# Patient Record
Sex: Female | Born: 1975 | Race: White | Hispanic: No | Marital: Married
Health system: Southern US, Community
[De-identification: ages and names within clinical notes are randomized; demographics above are authoritative.]

## PROBLEM LIST (undated history)

## (undated) DIAGNOSIS — I429 Cardiomyopathy, unspecified: Secondary | ICD-10-CM

---

## 2017-08-31 ENCOUNTER — Emergency Department (HOSPITAL_COMMUNITY): Payer: BLUE CROSS/BLUE SHIELD

## 2017-08-31 ENCOUNTER — Encounter (HOSPITAL_COMMUNITY): Payer: Self-pay | Admitting: Emergency Medicine

## 2017-08-31 ENCOUNTER — Other Ambulatory Visit: Payer: Self-pay

## 2017-08-31 ENCOUNTER — Emergency Department (HOSPITAL_COMMUNITY)
Admission: EM | Admit: 2017-08-31 | Discharge: 2017-08-31 | Disposition: A | Discharge status: Home / Self Care | Payer: BLUE CROSS/BLUE SHIELD | Attending: Emergency Medicine | Admitting: Emergency Medicine

## 2017-08-31 DIAGNOSIS — R002 Palpitations: Secondary | ICD-10-CM | POA: Diagnosis not present

## 2017-08-31 DIAGNOSIS — D649 Anemia, unspecified: Secondary | ICD-10-CM | POA: Diagnosis not present

## 2017-08-31 DIAGNOSIS — R55 Syncope and collapse: Secondary | ICD-10-CM | POA: Insufficient documentation

## 2017-08-31 HISTORY — DX: Cardiomyopathy, unspecified: I42.9

## 2017-08-31 LAB — URINALYSIS, ROUTINE W REFLEX MICROSCOPIC
Bilirubin Urine: NEGATIVE
GLUCOSE, UA: NEGATIVE mg/dL
HGB URINE DIPSTICK: NEGATIVE
KETONES UR: NEGATIVE mg/dL
Leukocytes, UA: NEGATIVE
Nitrite: NEGATIVE
PH: 6 (ref 5.0–8.0)
Protein, ur: NEGATIVE mg/dL
Specific Gravity, Urine: 1.019 (ref 1.005–1.030)

## 2017-08-31 LAB — BASIC METABOLIC PANEL
Anion gap: 7 (ref 5–15)
BUN: 13 mg/dL (ref 6–20)
CHLORIDE: 104 mmol/L (ref 98–111)
CO2: 27 mmol/L (ref 22–32)
CREATININE: 0.62 mg/dL (ref 0.44–1.00)
Calcium: 9.4 mg/dL (ref 8.9–10.3)
GFR calc Af Amer: >60 mL/min (ref >60)
GFR calc non Af Amer: >60 mL/min (ref >60)
Glucose, Bld: 101 mg/dL — ABNORMAL HIGH (ref 70–99)
POTASSIUM: 3.9 mmol/L (ref 3.5–5.1)
Sodium: 138 mmol/L (ref 135–145)

## 2017-08-31 LAB — POC OCCULT BLOOD, ED: FECAL OCCULT BLD: NEGATIVE

## 2017-08-31 LAB — CBC
HEMATOCRIT: 31 % — AB (ref 36.0–46.0)
Hemoglobin: 9.6 g/dL — ABNORMAL LOW (ref 12.0–15.0)
MCH: 24.7 pg — AB (ref 26.0–34.0)
MCHC: 31 g/dL (ref 30.0–36.0)
MCV: 79.7 fL (ref 78.0–100.0)
PLATELETS: 295 10*3/uL (ref 150–400)
RBC: 3.89 MIL/uL (ref 3.87–5.11)
RDW: 13.9 % (ref 11.5–15.5)
WBC: 5 10*3/uL (ref 4.0–10.5)

## 2017-08-31 LAB — I-STAT BETA HCG BLOOD, ED (MC, WL, AP ONLY)

## 2017-08-31 LAB — BRAIN NATRIURETIC PEPTIDE: B NATRIURETIC PEPTIDE 5: 23.9 pg/mL (ref 0.0–100.0)

## 2017-08-31 LAB — CBG MONITORING, ED: Glucose-Capillary: 94 mg/dL (ref 70–99)

## 2017-08-31 LAB — TROPONIN I

## 2017-08-31 MED ORDER — FERROUS SULFATE 325 (65 FE) MG PO TABS: 325.0000 mg | ORAL_TABLET | Freq: Every day | ORAL | 0 refills | Status: AC

## 2017-08-31 MED ORDER — SODIUM CHLORIDE 0.9 % IV BOLUS
500.0000 mL | Freq: Once | INTRAVENOUS | Status: AC
  Administered 2017-08-31: 500 mL via INTRAVENOUS

## 2017-08-31 NOTE — ED Notes (Signed)
Patient transported to X-ray 

## 2017-08-31 NOTE — ED Notes (Signed)
Discharge instructions discussed with Pt. Pt verbalized understanding. Pt stable and ambulatory.    

## 2017-08-31 NOTE — Discharge Instructions (Addendum)
Please take the iron supplement once daily for the next 2 weeks.  Please follow-up with your primary care doctor to have your hemoglobin rechecked.  Please follow-up with your cardiologist in regards to today's visit.  Please return to the emergency department immediately if you have any new or worsening symptoms in the meantime.

## 2017-08-31 NOTE — ED Notes (Signed)
Pt reports hands feeling really cold on the way to the hospital like they weren't getting enough blood flow.

## 2017-08-31 NOTE — ED Provider Notes (Signed)
[facility] Provider Note   CSN: [id number] Arrival date & time: 08/31/17  1106     History   Chief Complaint Chief Complaint  Patient presents with  . Near Syncope   History provided by pt and her husband at bedside.  HPI Madison Lynn is a 42 y.o. female.  HPI   Pt is a 42 y/o female with a h/o cardiomyopathy and anemia who presents to the ED today for evaluation of near syncope which occurred earlier today. she states she started having palpitations while standing. She felt like her heart was slowing and she was about to pass out. States that she then laid onto the ground. Denies that she fell or hit her head. She denies that she had a syncopal episode. She states she has had sxs like this before. She reports associated shortness of breath and cold hands, but denies associated chest pain, nausea, diaphoresis, numbness/weakness. She denies any other associated sxs. States that currently she feels grossly back to baseline, though she still feels like she is having some PVC's. States it is common for her to have PVC's. Denies leg pain/swelling, hemoptysis, recent surgery/trauma, recent long travel (plane ride here only 2 hours), hormone use, personal hx of cancer, or hx of DVT/PE. Recent heavy menses 2 weeks ago. H/o heavy menses and anemia. Not currently on iron.  Pt is visiting here for the weekend. [place] cardi[place] in [place], [place]. states that he has had palpitations over the last several months. She wore a holter monitor and had several episodes of unsustained vtach. States her cardiologist was monitoring her for this. She continued to have episodes of palpitations and wore another monitor. She was then found to have atrial flutter. States she was scheduled for an ablation yesterday in [place]. States that she spoke with her cardiologist and asked if it was okay to travel since she has not been symptomatic recently. She was cleared  to travel this weekend. She plans to go back to [place], [place] on 09/03/17.   Records reviewed, pt had cardiac MRI on 08/25/17 which showed, "1.  The left ventricle is normal in size with preserved systolic  function. Ejection fraction is quantified to be 53%, when compared to  prior examination of 49%  Quantitative left ventricular functional  values are as described above. Viability/scar imaging is normal.  There is no evidence of prior  myocardial damage. No abnormal enhancement of the myocardium is seen  after contrast administration. Myocardial T1 imaging was within  normal reference range, consistent with viability/scar imaging, with  no evidence of diffuse fibrosis present. No evidence of hypertrophic cardiomyopathy or left ventricular  Noncompaction. There is reduction in global left ventricular long axis strain,  quantified to be approximately -11.3%. 2.  The right ventricle is normal in size and function. No localised  or generalized aneurysmal dilation is seen."  Pt states her BP normally runs in the 90s/50s.   Cardiologist: Dr. Alford HighlandBiswajit Karr, [facility], [place], [place]   Past Medical History:  Diagnosis Date  . Cardiomyopathy (HCC)     There are no active problems to display for this patient.   History reviewed. No pertinent surgical history.   OB History   None      Home Medications    Prior to Admission medications   Medication Sig Start Date End Date Taking? Authorizing Provider  ferrous sulfate 325 (65 FE) MG tablet Take 1 tablet (  325 mg total) by mouth daily for 14 days. 08/31/17 09/14/17  Lora Chavers S, PA-C    Family History No family history on file.  Social History Social History   Tobacco Use  . Smoking status: Not on file  Substance Use Topics  . Alcohol use: Not on file  . Drug use: Not on file     Allergies   Latex   Review of Systems Review of Systems  Constitutional: Negative for fever.  HENT: Negative  for congestion and rhinorrhea.   Eyes: Negative for visual disturbance.  Respiratory: Positive for shortness of breath. Negative for cough.   Cardiovascular: Positive for palpitations. Negative for chest pain and leg swelling.  Gastrointestinal: Negative for abdominal pain, blood in stool, constipation, diarrhea, nausea and vomiting.  Genitourinary: Negative for dysuria, frequency and urgency.  Musculoskeletal: Negative for back pain.  Skin: Negative for wound.  Neurological: Positive for light-headedness. Negative for weakness and numbness.       Near syncope    Physical Exam Updated Vital Signs BP (!) 94/56   Pulse 66   Temp 98.3 F (36.8 C) (Oral)   Resp 14   LMP 08/17/2017   SpO2 99%   Physical Exam  Constitutional: She appears well-developed and well-nourished. No distress.  HENT:  Head: Normocephalic and atraumatic.  Mouth/Throat: Oropharynx is clear and moist.  Eyes: Conjunctivae are normal.  Neck: Neck supple.  Cardiovascular: Normal rate, regular rhythm and normal heart sounds.  No murmur heard. Pulmonary/Chest: Effort normal and breath sounds normal. No stridor. No respiratory distress. She has no wheezes.  Abdominal: Soft. Bowel sounds are normal. There is no tenderness. There is no guarding.  Musculoskeletal:  No calf TTP, erythema, swelling.  Neurological: She is alert.  Skin: Skin is warm and dry. Capillary refill takes less than 2 seconds.  Psychiatric: She has a normal mood and affect.  Nursing note and vitals reviewed.   ED Treatments / Results  Labs (all labs ordered are listed, but only abnormal results are displayed) Labs Reviewed  BASIC METABOLIC PANEL - Abnormal; Notable for the following components:      Result Value   Glucose, Bld 101 (*)    All other components within normal limits  CBC - Abnormal; Notable for the following components:   Hemoglobin 9.6 (*)    HCT 31.0 (*)    MCH 24.7 (*)    All other components within normal limits    URINALYSIS, ROUTINE W REFLEX MICROSCOPIC  BRAIN NATRIURETIC PEPTIDE  TROPONIN I  CBG MONITORING, ED  I-STAT BETA HCG BLOOD, ED ([facility], [facility], [facility] ONLY)  POC OCCULT BLOOD, ED    EKG EKG Interpretation  Date/Time:  Wednesday August 31 2017 11:13:19 EDT Ventricular Rate:  91 PR Interval:  160 QRS Duration: 80 QT Interval:  376 QTC Calculation: 462 R Axis:   -100 Text Interpretation:  Normal sinus rhythm with sinus arrhythmia Possible Left atrial enlargement Right superior axis deviation Anterior infarct , age undetermined Abnormal ECG Confirmed by Bethann Berkshire [phone]) on 08/31/2017 1:47:58 PM   Radiology Dg Chest 2 View  Result Date: 08/31/2017 CLINICAL DATA:  History of cardiomyopathy. Irregular cardiac rhythm this morning. The patient is scheduled for an ablation procedure on August 28 and [place]. EXAM: CHEST - 2 VIEW COMPARISON:  None in PACs FINDINGS: The lungs are mildly hyperinflated but clear. The heart and pulmonary vascularity are normal. The mediastinum is normal in width. There is no pleural effusion. The bony thorax exhibits no acute abnormality.  IMPRESSION: There is no CHF, pneumonia, nor other acute cardiopulmonary abnormality. Electronically Signed   By: David  [place] M.D.   On: 08/31/2017 13:38    Procedures Procedures (including critical care time)  Medications Ordered in ED Medications  sodium chloride 0.9 % bolus 500 mL (0 mLs Intravenous Stopped 08/31/17 1439)     Initial Impression / Assessment and Plan / ED Course  I have reviewed the triage vital signs and the nursing notes.  Pertinent labs & imaging results that were available during my care of the patient were reviewed by me and considered in my medical decision making (see chart for details).    Discussed pt presentation, exam findings, and workup with Dr. Estell Harpin, who feels that if the patients workup is negative then she is appropriate for discharge with close f/u with her cardiologist.   Final Clinical  Impressions(s) / ED Diagnoses   Final diagnoses:  Near syncope  Anemia, unspecified type   Presenting for evaluation of a near syncopal episode which occurred prior to arrival.  Initially patient is borderline hypotensive, which she states is chronic for her.  Her vital signs are otherwise stable throughout her stay in the ED.  Her work-up today has been fairly benign.  Her CBC she does show some anemia and she does report a history of this.  Suspect secondary to NSAID use.  Has history of heavy periods and recently had her menses 2 weeks ago.  Is not currently on iron supplementation.  We will start her on supplementation and have her follow-up with her PCP in regards to this.  CMP, troponin, BNP are all within normal limits.  No gross electrolyte derangement.  UA negative.  pRegnancy test negative.  Hemoccult negative for blood.  ECG shows normal sinus rhythm with sinus arrhythmia.  Possible left atrial enlargement and right superior axis deviation.  A anterior infarct noted with age undetermined, do not suspect patient having ischemic symptoms today she is asymptomatic and denies any chest pain.  Patient has a history of cardiomyopathy and states that she has had palpitations in the past which have caused her to have near syncopal episodes. Her symptoms today seem consistent with symptoms she has had in the past. Denies chest pain. Her cardiologist cleared her to come to [place] for the week for vacation.  She has been observed in the ED for several hours and has remained asymptomatic and stable.  Feel that she is safe for discharge with close follow-up with her PCP at home.  Plan communicated with patient and her husband at bedside who voiced understanding of plan and reasons to return immediately to the ED.  All questions were answered.  ED Discharge Orders        Ordered    ferrous sulfate 325 (65 FE) MG tablet  Daily     08/31/17 [address] [place], PA-C 08/31/17 1538      Bethann Berkshire, MD 08/31/17 [phone]

## 2017-08-31 NOTE — ED Triage Notes (Signed)
Patient with history of cardiomyopathy complains of multiple near syncope episodes over the last two weeks. States today she felt like her heart felt like it paused today. Patient alert, oriented, and in no apparent distress at this time.

## 2017-08-31 NOTE — ED Notes (Addendum)
Pt states her bp is usually 90's/50's at baseline. PA Couture notified.

## 2019-07-06 IMAGING — DX DG CHEST 2V
2 series · 2 of 2 positions shown · non-contrast
Comparison: None in PACs

CLINICAL DATA: History of cardiomyopathy. Irregular cardiac rhythm
this morning. The patient is scheduled for an ablation procedure on
[DATE] and [HOSPITAL].

EXAM:
CHEST - 2 VIEW

[chest pa]
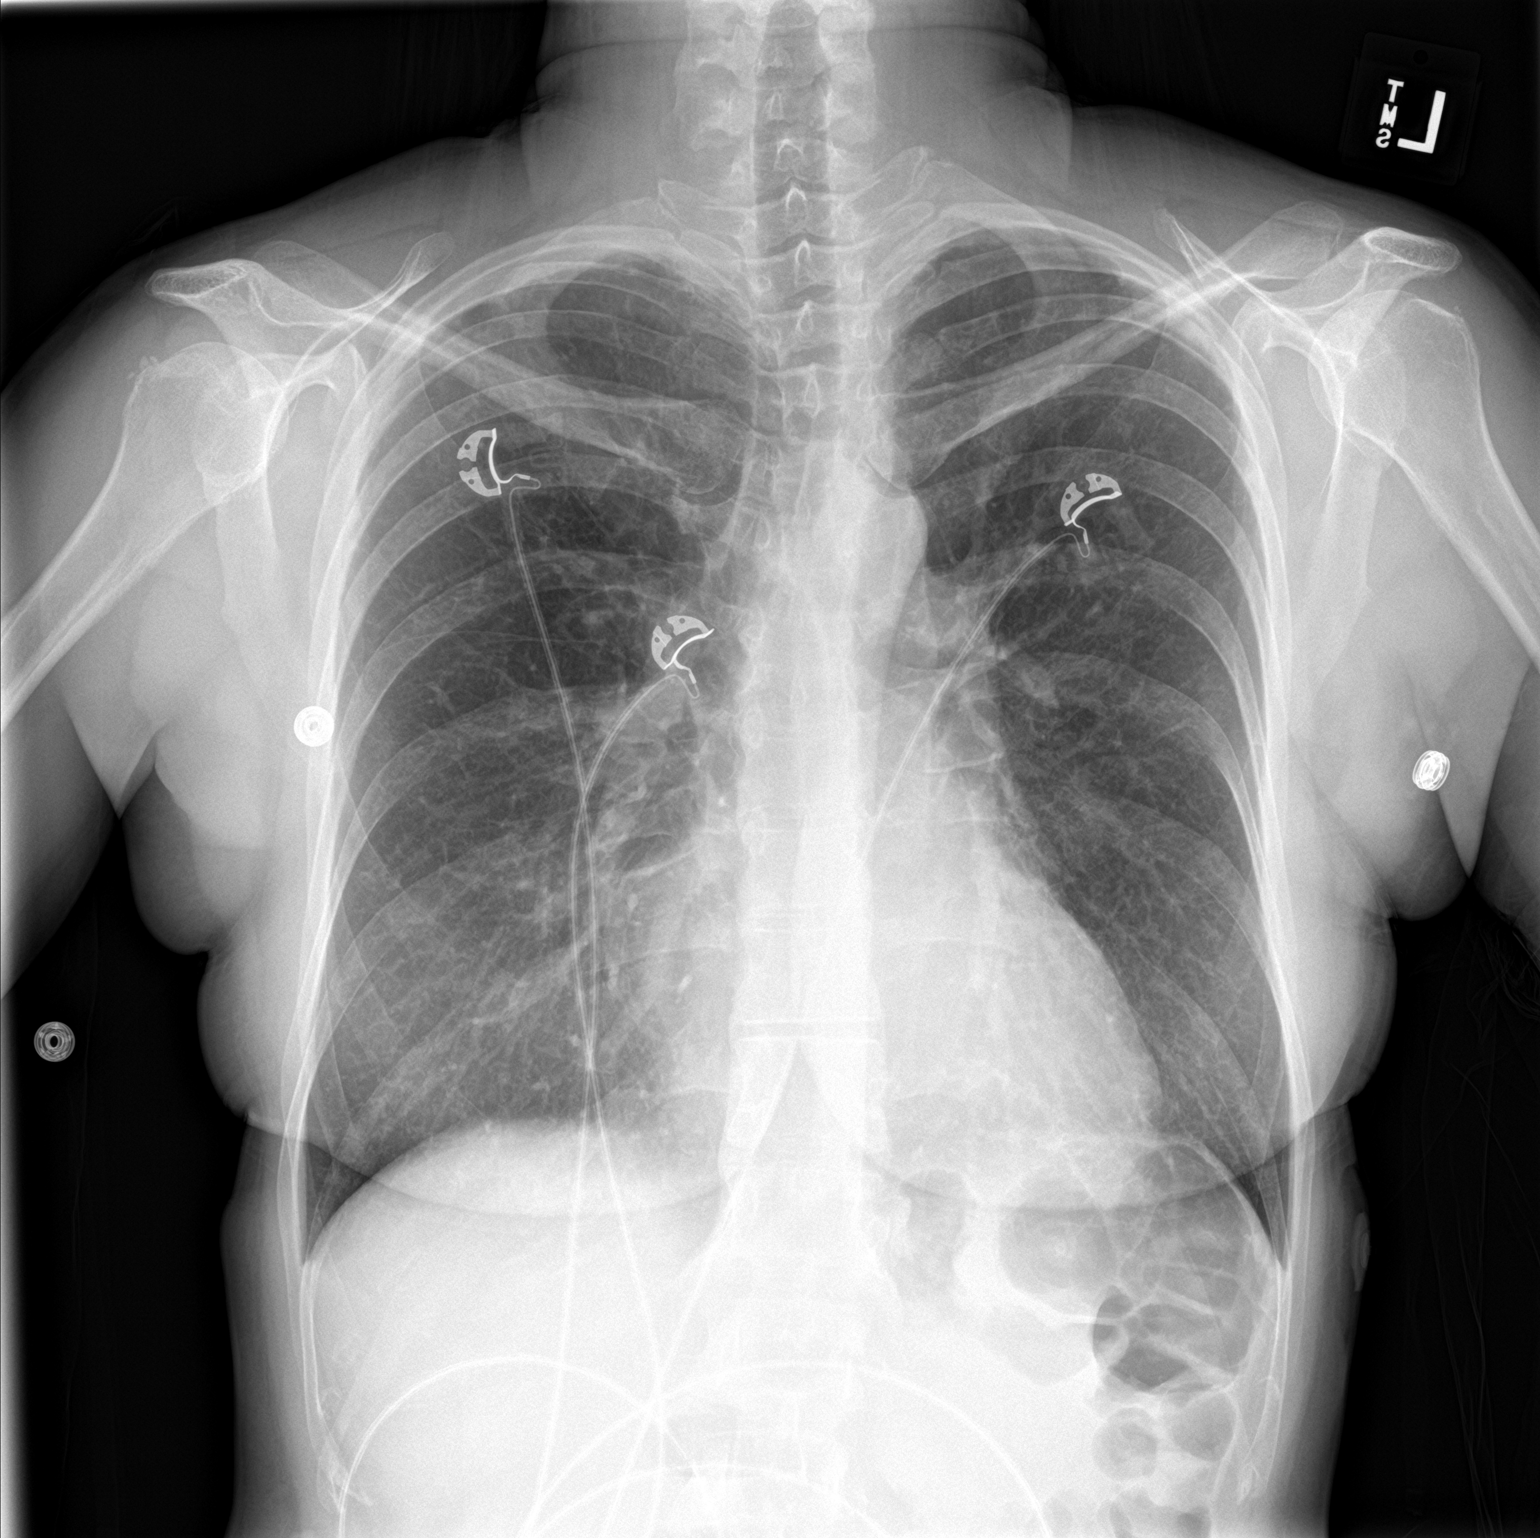

[chest lat]
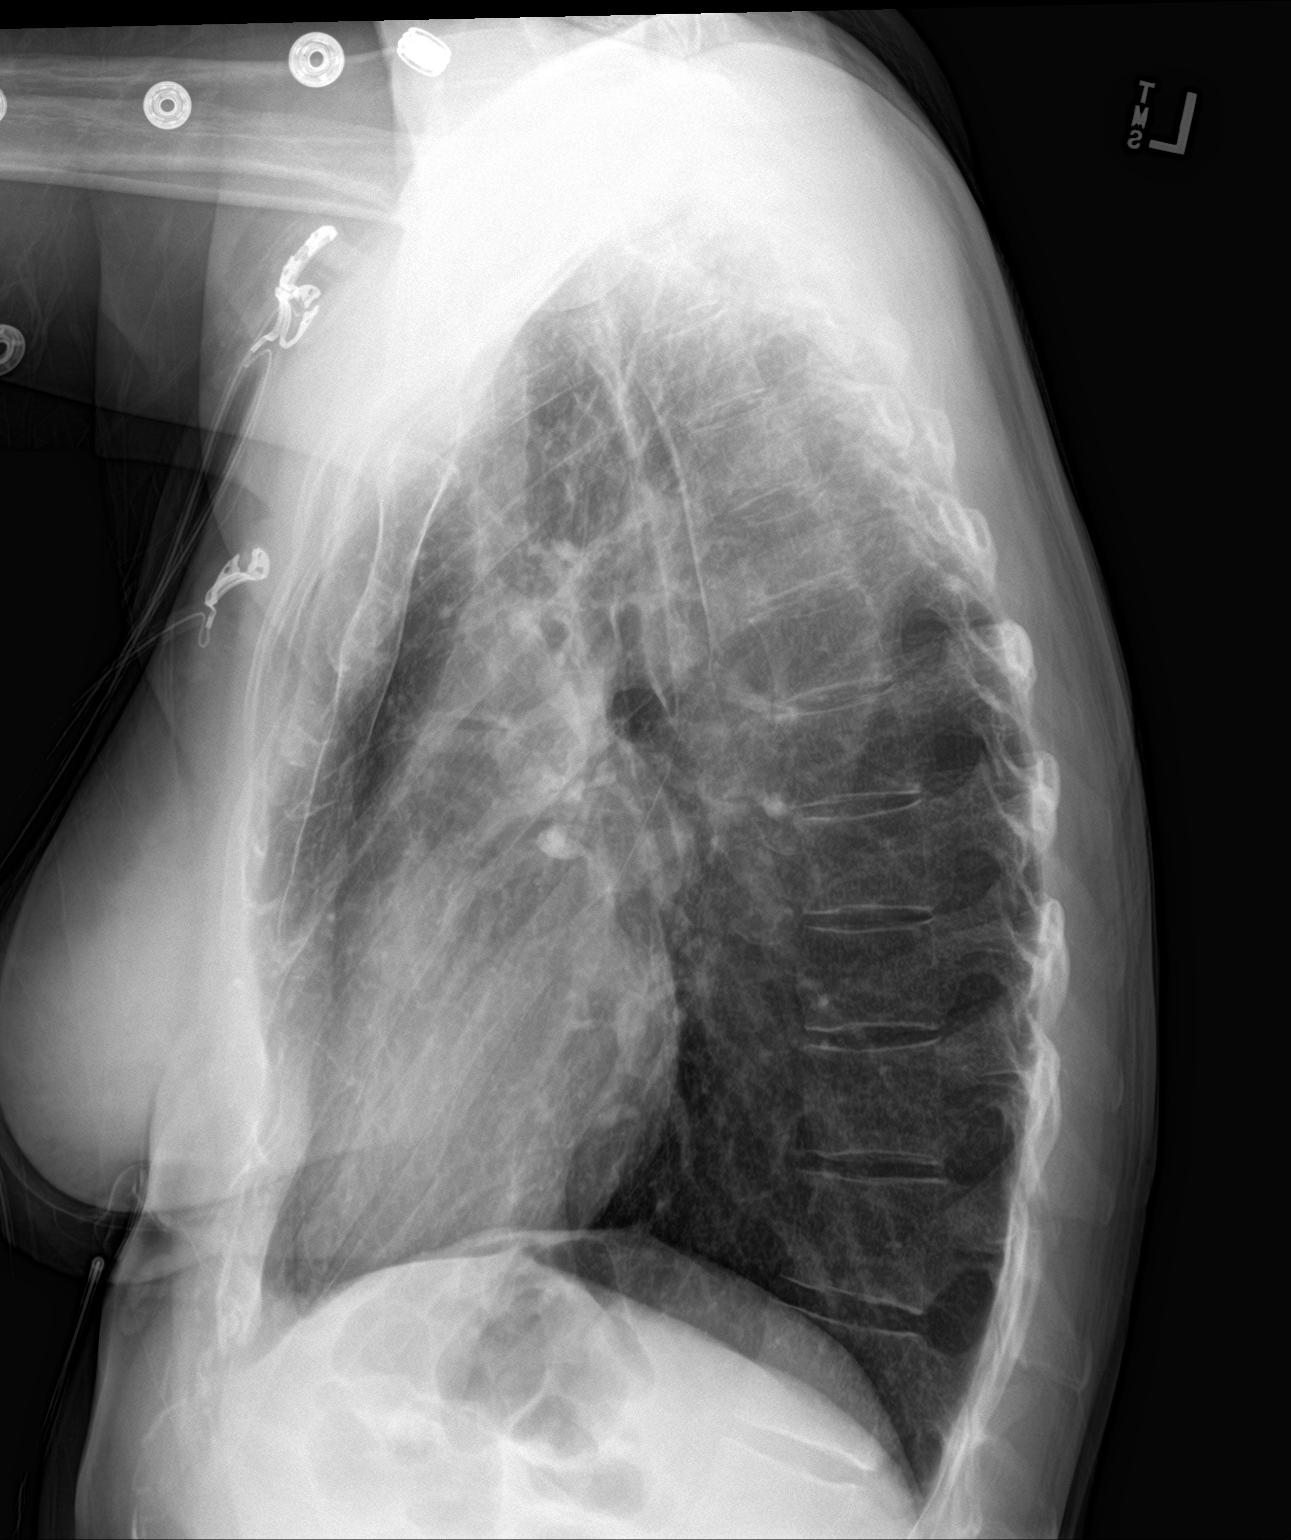

[2 of 2 positions shown; findings below may reference images not displayed]

FINDINGS: The lungs are mildly hyperinflated but clear. The heart and
pulmonary vascularity are normal. The mediastinum is normal in
width. There is no pleural effusion. The bony thorax exhibits no
acute abnormality.
IMPRESSION: There is no CHF, pneumonia, nor other acute cardiopulmonary
abnormality.
